# Patient Record
Sex: Female | Born: 1965 | Hispanic: Yes | Marital: Married
Health system: Southern US, Community
[De-identification: ages and names within clinical notes are randomized; demographics above are authoritative.]

---

## 2007-02-28 ENCOUNTER — Emergency Department: Payer: Self-pay | Admitting: Emergency Medicine

## 2014-02-21 ENCOUNTER — Emergency Department: Payer: Self-pay | Admitting: Emergency Medicine

## 2014-07-30 ENCOUNTER — Emergency Department: Payer: Self-pay | Admitting: Student
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
## 2017-09-13 ENCOUNTER — Encounter: Payer: Self-pay | Admitting: *Deleted

## 2017-11-29 ENCOUNTER — Ambulatory Visit: Payer: Self-pay

## 2018-01-10 ENCOUNTER — Ambulatory Visit: Payer: Self-pay | Attending: Oncology | Admitting: *Deleted

## 2018-01-10 ENCOUNTER — Ambulatory Visit
Admission: RE | Admit: 2018-01-10 | Discharge: 2018-01-10 | Disposition: A | Discharge status: Home / Self Care | Payer: Self-pay | Source: Ambulatory Visit | Attending: Oncology | Admitting: Oncology

## 2018-01-10 VITALS — BP 120/80 | HR 88 | Temp 98.0°F | Ht 63.0 in | Wt 170.0 lb

## 2018-01-10 DIAGNOSIS — Z0000 Encounter for general adult medical examination without abnormal findings: Secondary | ICD-10-CM

## 2018-01-10 NOTE — Progress Notes (Signed)
Subjective:     Patient ID: Madison Perkins, female   DOB: 12-01-1965, 52 y.o.   MRN: [id number]  HPI   Review of Systems     Objective:   Physical Exam  Pulmonary/Chest: Right breast exhibits no inverted nipple, no mass, no nipple discharge, no skin change and no tenderness. Left breast exhibits no inverted nipple, no mass, no nipple discharge, no skin change and no tenderness. Breasts are symmetrical.       Assessment:     52 year [facility] Hispanic [facility].  Lloyda, the interpreter present during the interview and exam.  [facility]   Joellyn QuailsChristy Burton is requesting those results.  [place] in [place] in 2017.  Clinical breast exam unremarkable.  Taught self breast awareness.  Patient has been screened for eligibility.  She does not have any insurance, Medicare or Medicaid.  She also meets financial eligibility.  Hand-out given on the Affordable Care Act.    Plan:     Screening mammogram ordered.  Will follow-up per [facility] protocol.

## 2018-01-10 NOTE — Patient Instructions (Signed)
Gave patient hand-out, Women Staying Healthy, Active and Well from [facility], with education on breast health, pap smears, heart and colon health. 

## 2018-01-19 ENCOUNTER — Other Ambulatory Visit: Payer: Self-pay | Admitting: *Deleted

## 2018-01-19 ENCOUNTER — Inpatient Hospital Stay
Admission: RE | Admit: 2018-01-19 | Discharge: 2018-01-19 | Disposition: A | Discharge status: Home / Self Care | Payer: Self-pay | Source: Ambulatory Visit | Attending: *Deleted | Admitting: *Deleted

## 2018-01-19 DIAGNOSIS — Z9289 Personal history of other medical treatment: Secondary | ICD-10-CM

## 2018-01-22 ENCOUNTER — Encounter: Payer: Self-pay | Admitting: *Deleted

## 2018-01-22 NOTE — Progress Notes (Signed)
Letter mailed from the [facility] to inform patient of her normal mammogram results.  Patient is to follow-up with annual screening in one year.  HSIS to Christy.
